# Patient Record
Sex: Male | Born: 1980 | Race: Black or African American | Hispanic: No | Marital: Single | Smoking: Never smoker
Health system: Southern US, Community
[De-identification: ages and names within clinical notes are randomized; demographics above are authoritative.]

## PROBLEM LIST (undated history)

## (undated) DIAGNOSIS — J45909 Unspecified asthma, uncomplicated: Secondary | ICD-10-CM

---

## 2001-09-23 ENCOUNTER — Emergency Department (HOSPITAL_COMMUNITY): Admission: EM | Admit: 2001-09-23 | Discharge: 2001-09-23 | Payer: Self-pay | Admitting: Emergency Medicine

## 2004-04-21 ENCOUNTER — Emergency Department (HOSPITAL_COMMUNITY): Admission: EM | Admit: 2004-04-21 | Discharge: 2004-04-21 | Payer: Self-pay | Admitting: Emergency Medicine

## 2006-07-27 IMAGING — CR DG CHEST 2V
2 series · 2 of 2 positions shown · non-contrast
Comparison: None.

CLINICAL DATA: Shortness of breath.  Asthma. 
 CHEST ? 2 VIEW:

[w chest pa]
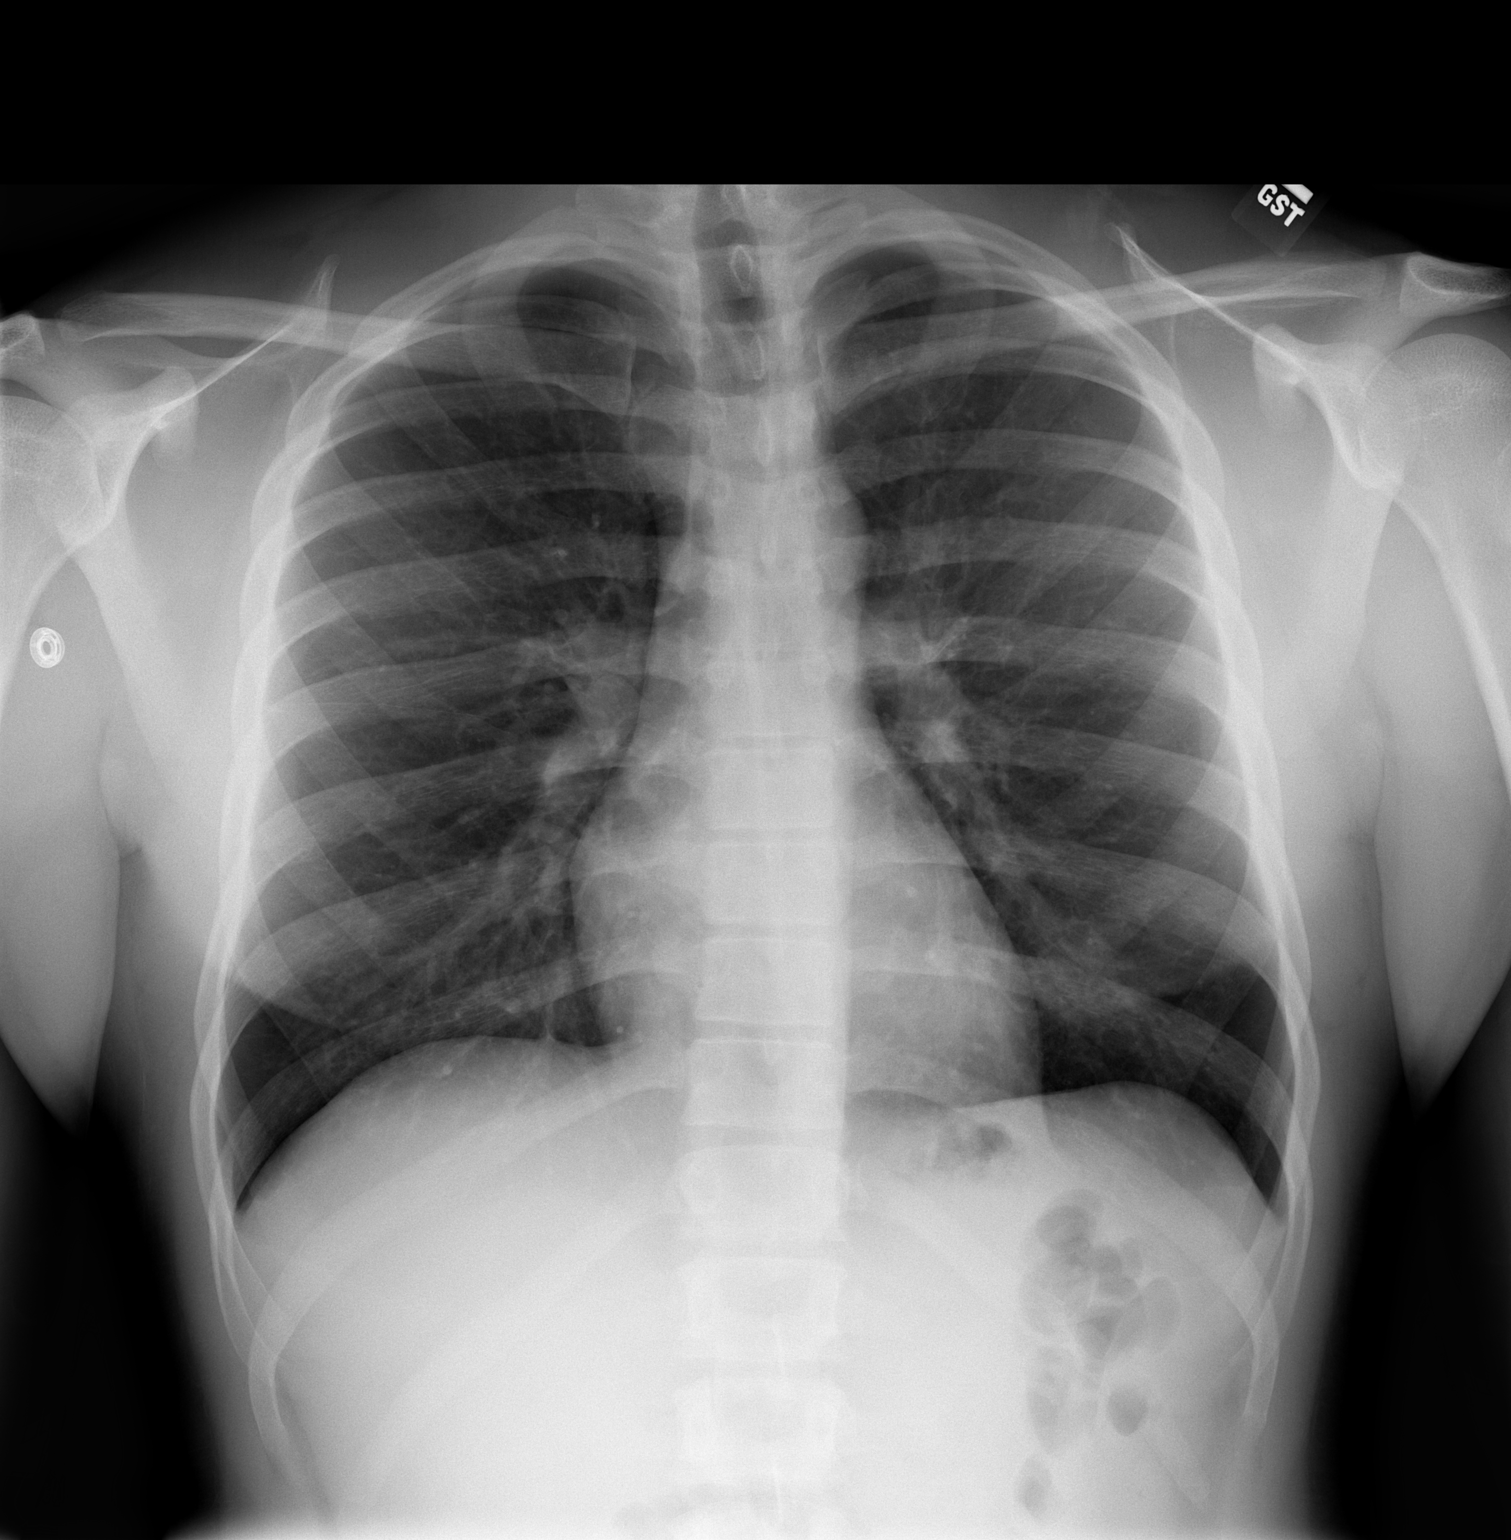

[w chest lat]
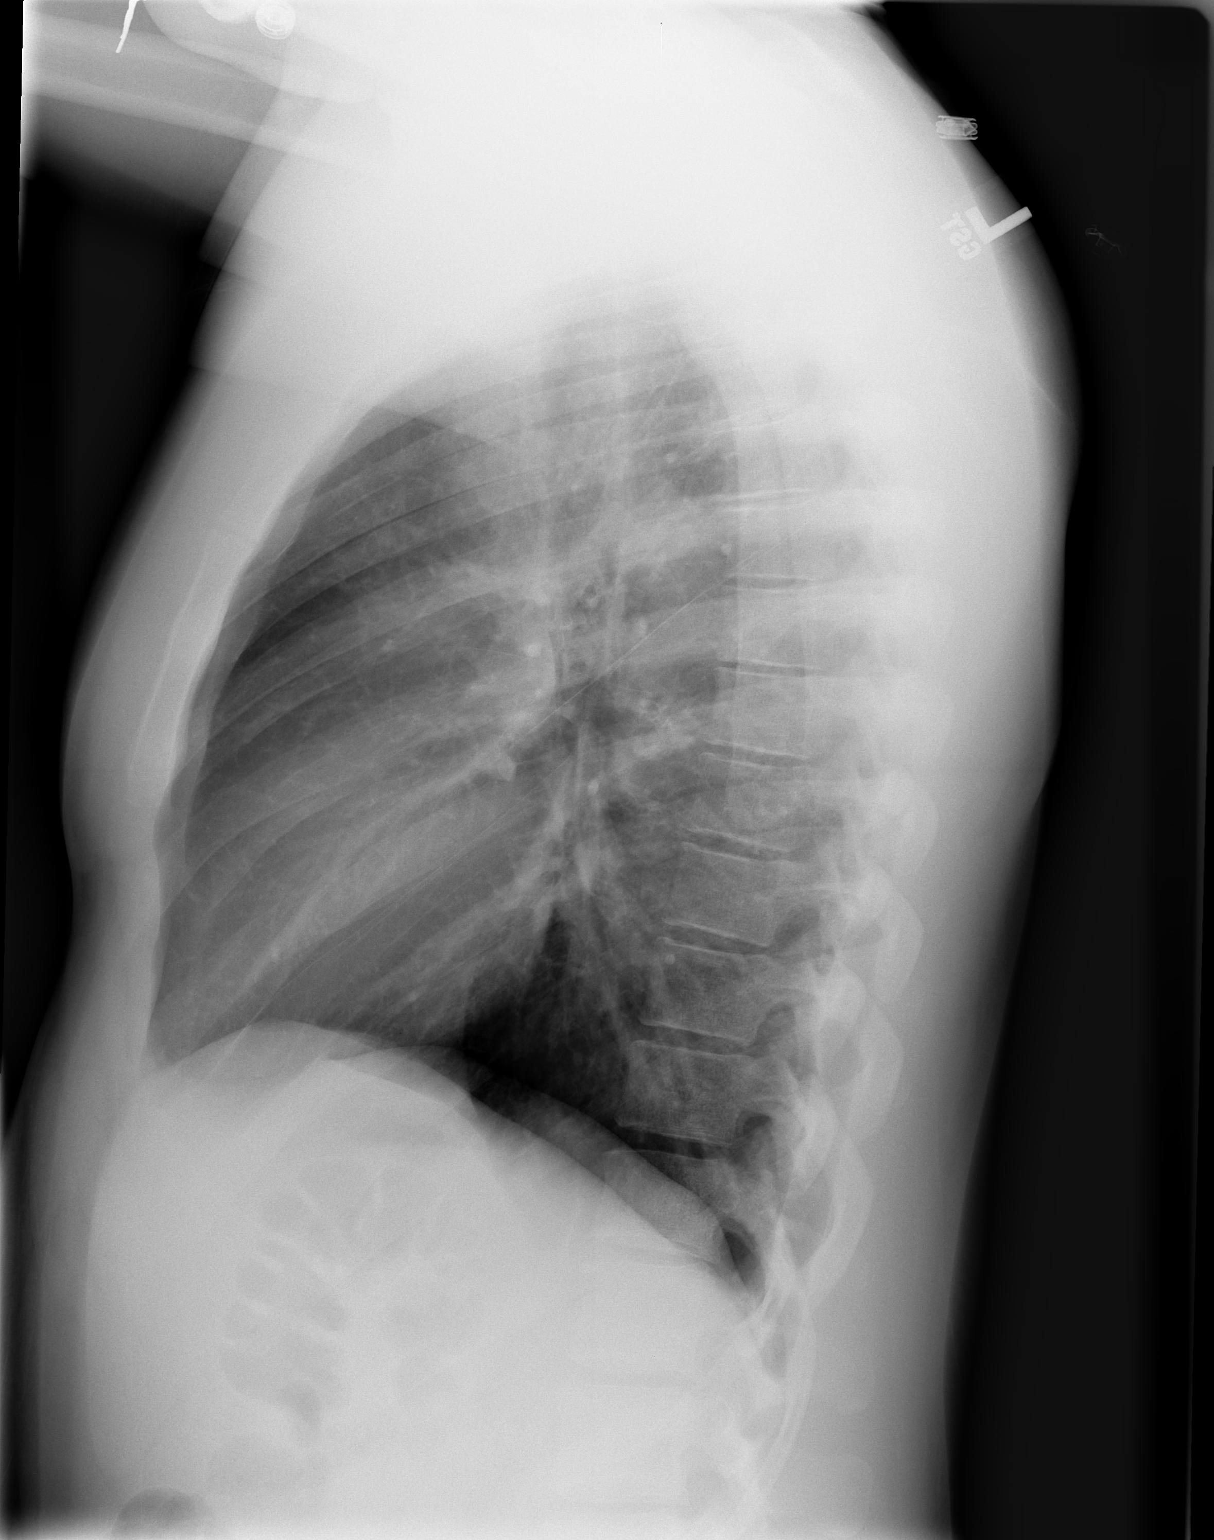

[2 of 2 positions shown; findings below may reference images not displayed]

The heart size and mediastinal contours are normal.  The lungs are clear.  The visualized skeleton is unremarkable.
IMPRESSION: No active disease.

## 2015-03-31 ENCOUNTER — Emergency Department (HOSPITAL_COMMUNITY)
Admission: EM | Admit: 2015-03-31 | Discharge: 2015-03-31 | Disposition: A | Discharge status: Home / Self Care | Payer: Managed Care, Other (non HMO) | Attending: Emergency Medicine | Admitting: Emergency Medicine

## 2015-03-31 ENCOUNTER — Encounter (HOSPITAL_COMMUNITY): Payer: Self-pay | Admitting: Nurse Practitioner

## 2015-03-31 ENCOUNTER — Emergency Department (HOSPITAL_COMMUNITY): Payer: Managed Care, Other (non HMO)

## 2015-03-31 DIAGNOSIS — J45901 Unspecified asthma with (acute) exacerbation: Secondary | ICD-10-CM | POA: Insufficient documentation

## 2015-03-31 DIAGNOSIS — R0789 Other chest pain: Secondary | ICD-10-CM | POA: Diagnosis not present

## 2015-03-31 DIAGNOSIS — N644 Mastodynia: Secondary | ICD-10-CM | POA: Diagnosis not present

## 2015-03-31 DIAGNOSIS — M62838 Other muscle spasm: Secondary | ICD-10-CM | POA: Diagnosis not present

## 2015-03-31 DIAGNOSIS — R079 Chest pain, unspecified: Secondary | ICD-10-CM | POA: Diagnosis present

## 2015-03-31 HISTORY — DX: Unspecified asthma, uncomplicated: J45.909

## 2015-03-31 LAB — I-STAT TROPONIN, ED: TROPONIN I, POC: 0 ng/mL (ref 0.00–0.08)

## 2015-03-31 LAB — BASIC METABOLIC PANEL
Anion gap: 8 (ref 5–15)
BUN: 10 mg/dL (ref 6–20)
CALCIUM: 8.8 mg/dL — AB (ref 8.9–10.3)
CO2: 28 mmol/L (ref 22–32)
CREATININE: 1.21 mg/dL (ref 0.61–1.24)
Chloride: 104 mmol/L (ref 101–111)
GFR calc Af Amer: >60 mL/min (ref >60)
GLUCOSE: 94 mg/dL (ref 65–99)
POTASSIUM: 3.9 mmol/L (ref 3.5–5.1)
SODIUM: 140 mmol/L (ref 135–145)

## 2015-03-31 LAB — CBC
HCT: 46.2 % (ref 39.0–52.0)
Hemoglobin: 14.5 g/dL (ref 13.0–17.0)
MCH: 26.6 pg (ref 26.0–34.0)
MCHC: 31.4 g/dL (ref 30.0–36.0)
MCV: 84.8 fL (ref 78.0–100.0)
PLATELETS: 149 10*3/uL — AB (ref 150–400)
RBC: 5.45 MIL/uL (ref 4.22–5.81)
RDW: 13.4 % (ref 11.5–15.5)
WBC: 4.8 10*3/uL (ref 4.0–10.5)

## 2015-03-31 MED ORDER — KETOROLAC TROMETHAMINE 60 MG/2ML IM SOLN
60.0000 mg | Freq: Once | INTRAMUSCULAR | Status: AC
  Administered 2015-03-31: 60 mg via INTRAMUSCULAR
  Filled 2015-03-31: qty 2

## 2015-03-31 MED ORDER — TRAMADOL HCL 50 MG PO TABS: 50.0000 mg | ORAL_TABLET | Freq: Four times a day (QID) | ORAL | Status: AC | PRN

## 2015-03-31 MED ORDER — DIAZEPAM 5 MG/ML IJ SOLN
5.0000 mg | Freq: Once | INTRAMUSCULAR | Status: DC
  Filled 2015-03-31: qty 2

## 2015-03-31 MED ORDER — DIAZEPAM 5 MG PO TABS: 5.0000 mg | ORAL_TABLET | Freq: Two times a day (BID) | ORAL | Status: AC | PRN

## 2015-03-31 NOTE — Discharge Instructions (Signed)

## 2015-03-31 NOTE — ED Notes (Signed)
Pt c/o 1 day history of intermittent middle/R sided "squeezing" chest pain radiating into his back. He reports mild SOB, cough. Denies nausea, diaphoresis, fevers. Pain increased with movement. Pain relieved by nothing. A&ox4, resp e/u

## 2015-03-31 NOTE — ED Provider Notes (Signed)
CSN: [id number]     Arrival date & time 03/31/15  1606 History   First MD Initiated Contact with Patient 03/31/15 2021     Chief Complaint  Patient presents with  . Chest Pain     (Consider location/radiation/quality/duration/timing/severity/associated sxs/prior Treatment) HPI   11 o'clock yesterday morning the patient was changing his sons diaper when he felt in his upper back from the right shoulder to the left side, cusing him to loose his breath. He then developed chest pain and shortness of breath. He tried laying down which did not help. At 1:30 pm he went to [facility] and did an EKG and they said everything looked and felt like he had costochondritis. He was prescribed Indomethacin but the pharmacy said that they were out. He has tried Bare Aspirin, he has taken a total of 8 throughout the day but it has not helped his pain.  During lunch today the pain hit again really bad while he was trying to eat. He called his doctor and he was advised to go to the ER.  No N/V/D, no diaphoresis, cold clammy, no hot flashes, loc, headaches, no weakness.   Past Medical History  Diagnosis Date  . Asthma    History reviewed. No pertinent past surgical history. History reviewed. No pertinent family history. Social History  Substance Use Topics  . Smoking status: Never Smoker   . Smokeless tobacco: None  . Alcohol Use: Yes    Review of Systems  Review of Systems All other systems negative except as documented in the HPI. All pertinent positives and negatives as reviewed in the HPI.   Allergies  Review of patient's allergies indicates no known allergies.  Home Medications   Prior to Admission medications   Medication Sig Start Date End Date Taking? Authorizing Provider  diazepam (VALIUM) 5 MG tablet Take 1 tablet (5 mg total) by mouth every 12 (twelve) hours as needed for muscle spasms. 03/31/15   Kaleyah Labreck Neva Seat, PA-C  traMADol (ULTRAM) 50 MG tablet Take 1 tablet (50 mg total) by mouth  every 6 (six) hours as needed. 03/31/15   Cain Fitzhenry Neva Seat, PA-C   BP 139/83 mmHg  Pulse 75  Temp(Src) 98.2 F (36.8 C) (Oral)  Resp 17  Ht  (1.651 m)  Wt 91.627 kg  BMI 33.61 kg/m2  SpO2 97% Physical Exam  Constitutional: He appears well-developed and well-nourished. No distress.  HENT:  Head: Normocephalic and atraumatic.  Right Ear: Tympanic membrane and ear canal normal.  Left Ear: Tympanic membrane and ear canal normal.  Nose: Nose normal.  Mouth/Throat: Uvula is midline, oropharynx is clear and moist and mucous membranes are normal.  Eyes: Pupils are equal, round, and reactive to light.  Neck: Normal range of motion. Neck supple.  Cardiovascular: Normal rate and regular rhythm.   Pulmonary/Chest: Effort normal. No accessory muscle usage. No respiratory distress. He has no decreased breath sounds. He has no wheezes. He has no rhonchi. He has no rales. He exhibits tenderness and bony tenderness. He exhibits no laceration, no deformity, no swelling and no retraction. Right breast exhibits tenderness. Left breast exhibits no tenderness.  Abdominal: Soft.  No signs of abdominal distention  Musculoskeletal:       Right shoulder: He exhibits tenderness, pain and spasm. He exhibits normal range of motion, no bony tenderness, no swelling, no effusion, no crepitus, no deformity, no laceration, normal pulse and normal strength.       Arms: No LE swelling  Neurological: He is  alert.  Acting at baseline  Skin: Skin is warm and dry. No rash noted.  Nursing note and vitals reviewed.   ED Course  Procedures (including critical care time) Labs Review Labs Reviewed  BASIC METABOLIC PANEL - Abnormal; Notable for the following:    Calcium 8.8 (*)    All other components within normal limits  CBC - Abnormal; Notable for the following:    Platelets 149 (*)    All other components within normal limits  I-STAT TROPOININ, ED    Imaging Review Dg Chest 2 View  03/31/2015  CLINICAL  DATA:  Right chest pain since yesterday EXAM: CHEST  2 VIEW COMPARISON:  April 21, 2004 FINDINGS: The heart size and mediastinal contours are within normal limits. There is no focal infiltrate, pulmonary edema, or pleural effusion. The visualized skeletal structures are unremarkable. IMPRESSION: No active cardiopulmonary disease. Electronically Signed   By: Sherian ReinWei-Chen  Lin M.D.   On: 03/31/2015 16:56   I have personally reviewed and evaluated these images and lab results as part of my medical decision-making.   EKG Interpretation   Date/Time:  Thursday March 31 2015 16:15:03 EDT Ventricular Rate:  73 PR Interval:  120 QRS Duration: 80 QT Interval:  358 QTC Calculation: 394 R Axis:   77 Text Interpretation:  Normal sinus rhythm T wave abnormality, consider  inferior ischemia Abnormal ECG Confirmed by DELO  MD, DOUGLAS ([id number]) on  03/31/2015 8:54:52 PM      MDM   Final diagnoses:  Chest wall pain    Case discussed with Dr. Judd Lienelo, the patients pain is reproducible with rotation against resistance of right arm and also to palpation of shoulder blade and front of chest wall. Patients pain appears to be pleuritic and MSK after reviewing EKG and chest xray. He has no known risk factors.  Will treat with Toradol shot 60mg  and IM and Valium 5 mg.  Patient is to be discharged with recommendation to follow up with PCP in regards to today's hospital visit. Chest pain is not likely of cardiac or pulmonary etiology d/t presentation, perc negative, VSS, no tracheal deviation, no JVD or new murmur, RRR, breath sounds equal bilaterally, EKG without acute abnormalities, negative troponin, and negative CXR. Pt has been advised start a PPI and return to the ED is CP becomes exertional, associated with diaphoresis or nausea, radiates to left jaw/arm, worsens or becomes concerning in any way. Pt appears reliable for follow up and is agreeable to discharge.   Case has been discussed with and seen by Dr. Judd Lienelo who  agrees with the above plan to discharge. Rx: ultram and Valium    Marlon Peliffany Chamille Werntz, PA-C 03/31/15 2202  Geoffery Lyonsouglas Delo, MD 03/31/15 [phone]

## 2017-07-05 IMAGING — CR DG CHEST 2V
2 series · 2 of 2 positions shown · non-contrast
Comparison: April 21, 2004

CLINICAL DATA: Right chest pain since yesterday

EXAM:
CHEST  2 VIEW

[chest pa]
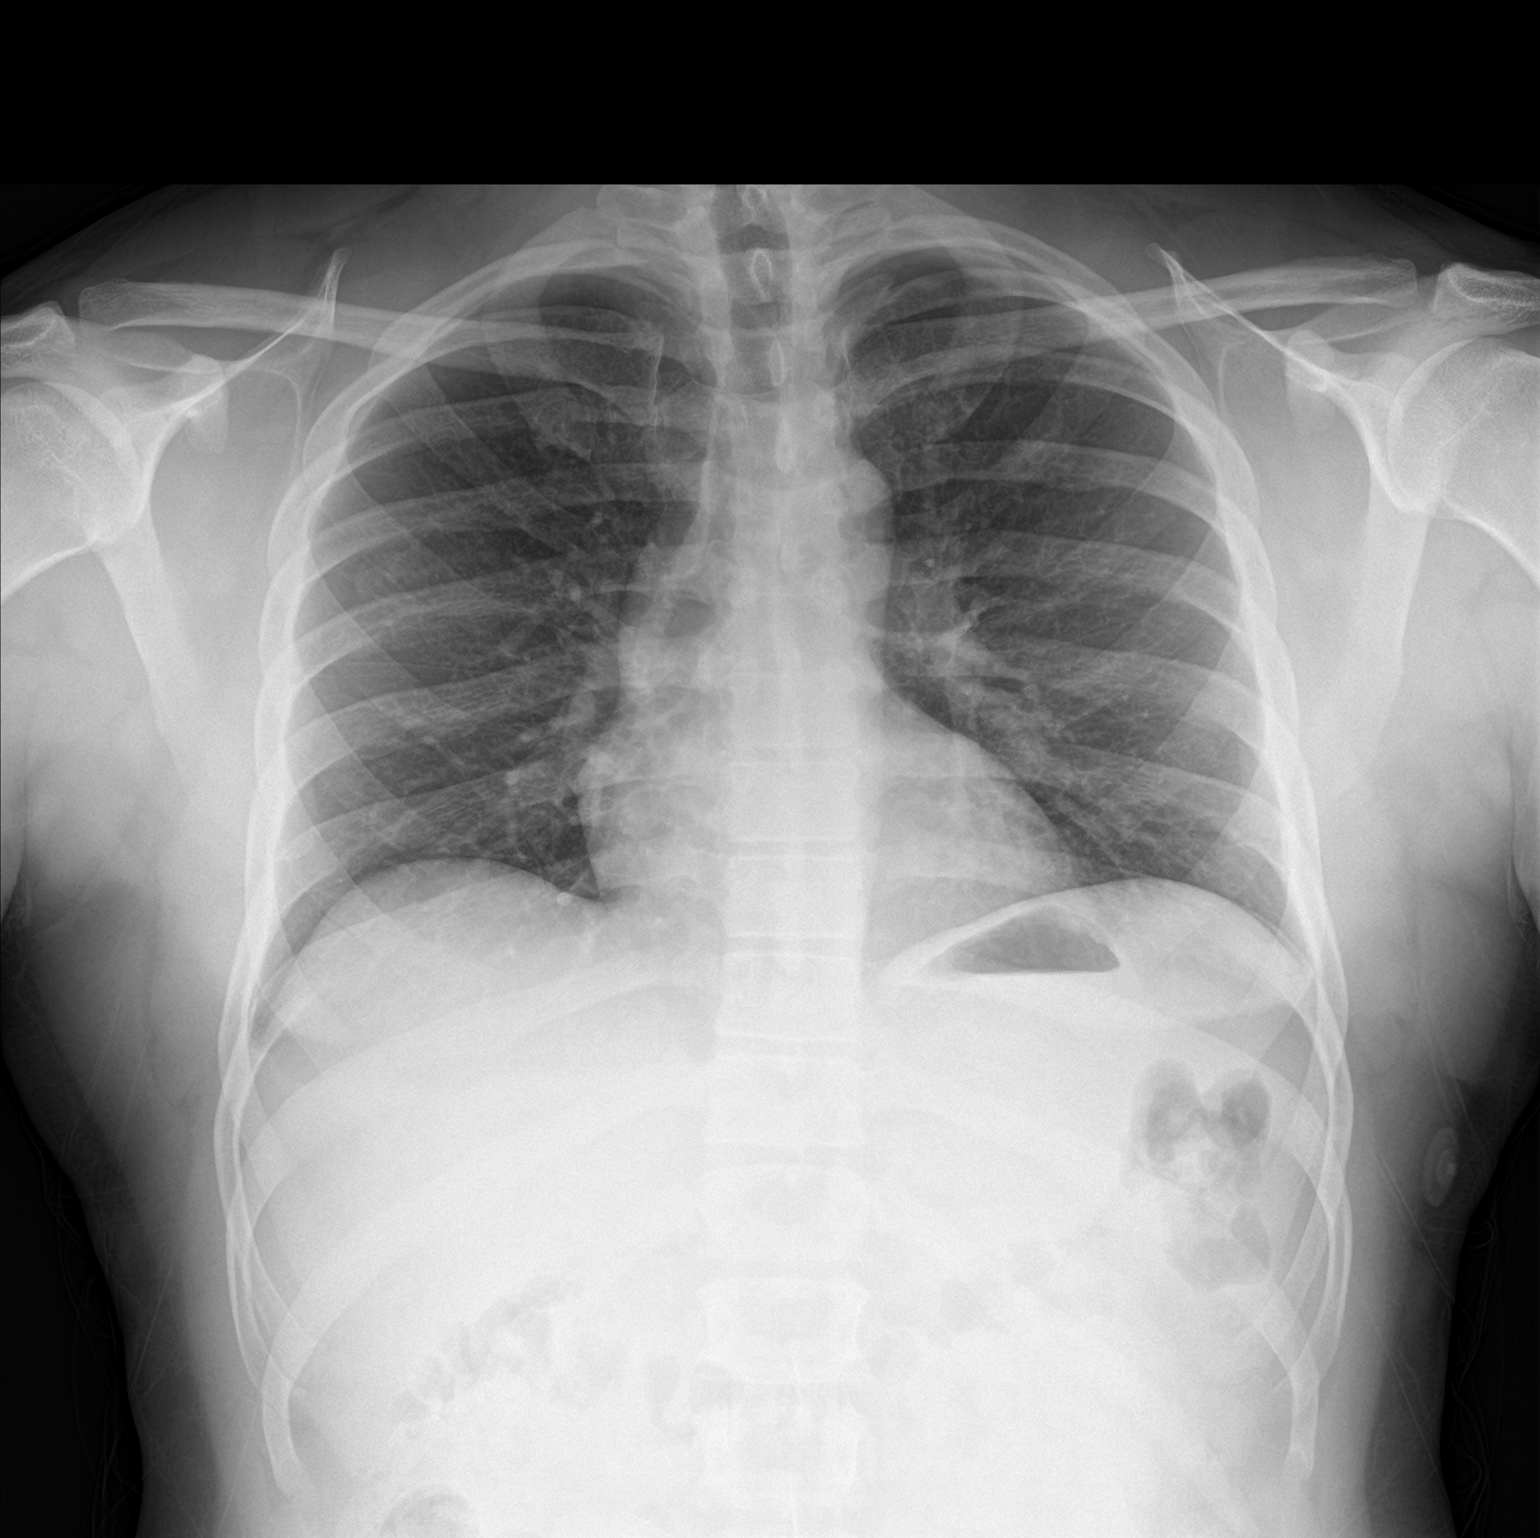

[chest lat]
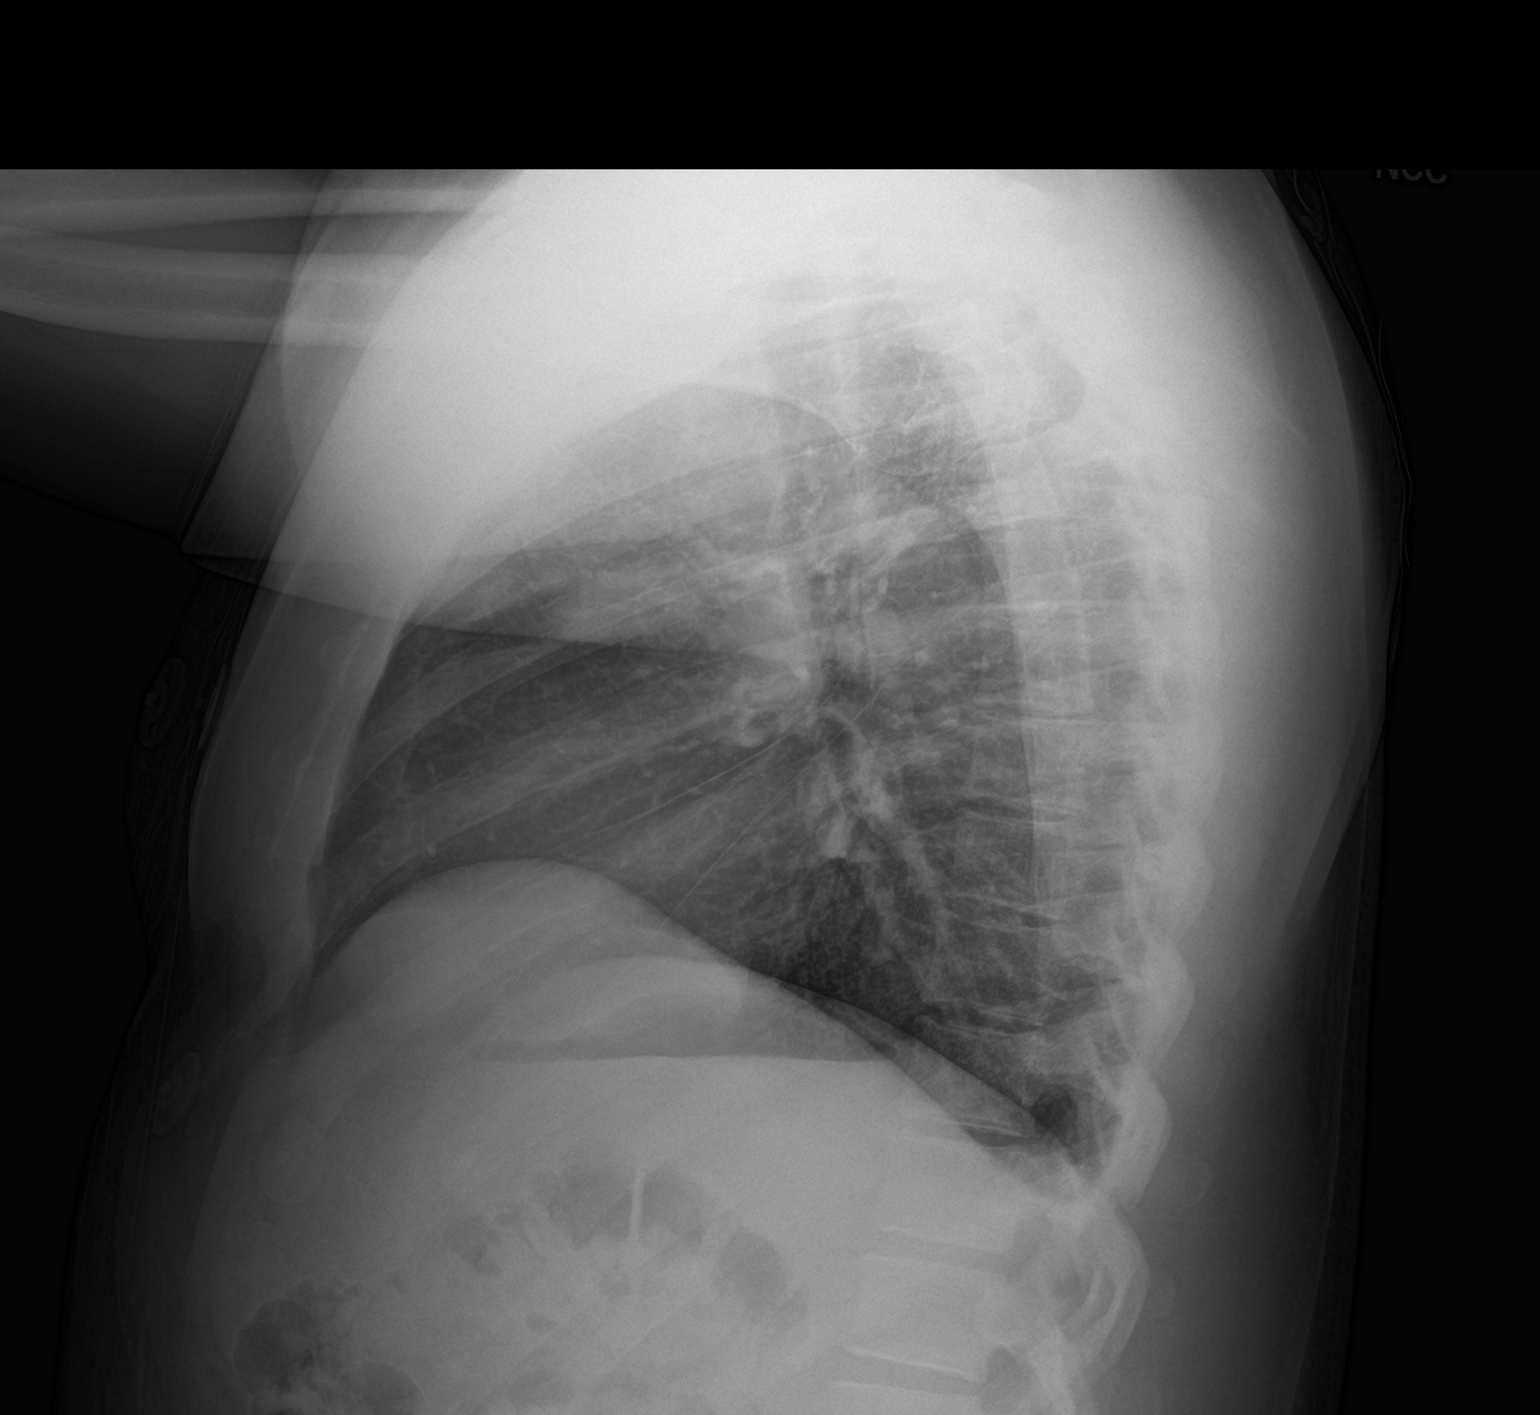

[2 of 2 positions shown; findings below may reference images not displayed]

FINDINGS: The heart size and mediastinal contours are within normal limits.
There is no focal infiltrate, pulmonary edema, or pleural effusion.
The visualized skeletal structures are unremarkable.
IMPRESSION: No active cardiopulmonary disease.
